# Patient Record
Sex: Female | Born: 1965 | Race: White | Hispanic: No | Marital: Married | Smoking: Never smoker
Health system: Southern US, Community
[De-identification: ages and names within clinical notes are randomized; demographics above are authoritative.]

## PROBLEM LIST (undated history)

## (undated) DIAGNOSIS — O26899 Other specified pregnancy related conditions, unspecified trimester: Secondary | ICD-10-CM

## (undated) DIAGNOSIS — O139 Gestational [pregnancy-induced] hypertension without significant proteinuria, unspecified trimester: Secondary | ICD-10-CM

## (undated) DIAGNOSIS — O360990 Maternal care for other rhesus isoimmunization, unspecified trimester, not applicable or unspecified: Secondary | ICD-10-CM

## (undated) DIAGNOSIS — Z6791 Unspecified blood type, Rh negative: Secondary | ICD-10-CM

## (undated) DIAGNOSIS — O09519 Supervision of elderly primigravida, unspecified trimester: Secondary | ICD-10-CM

## (undated) DIAGNOSIS — B019 Varicella without complication: Secondary | ICD-10-CM

## (undated) DIAGNOSIS — IMO0002 Reserved for concepts with insufficient information to code with codable children: Secondary | ICD-10-CM

## (undated) HISTORY — DX: Gestational (pregnancy-induced) hypertension without significant proteinuria, unspecified trimester: O13.9

## (undated) HISTORY — DX: Maternal care for other rhesus isoimmunization, unspecified trimester, not applicable or unspecified: O36.0990

## (undated) HISTORY — PX: FRACTURE SURGERY: SHX138

## (undated) HISTORY — DX: Unspecified blood type, rh negative: Z67.91

## (undated) HISTORY — DX: Other specified pregnancy related conditions, unspecified trimester: O26.899

## (undated) HISTORY — DX: Supervision of elderly primigravida, unspecified trimester: O09.519

## (undated) HISTORY — DX: Varicella without complication: B01.9

## (undated) HISTORY — DX: Reserved for concepts with insufficient information to code with codable children: IMO0002

---

## 1996-04-09 DIAGNOSIS — IMO0002 Reserved for concepts with insufficient information to code with codable children: Secondary | ICD-10-CM

## 1996-04-09 HISTORY — DX: Reserved for concepts with insufficient information to code with codable children: IMO0002

## 2004-09-13 ENCOUNTER — Inpatient Hospital Stay (HOSPITAL_COMMUNITY): Admission: AD | Admit: 2004-09-13 | Discharge: 2004-09-13 | Payer: Self-pay | Admitting: Obstetrics and Gynecology

## 2004-11-25 ENCOUNTER — Inpatient Hospital Stay (HOSPITAL_COMMUNITY): Admission: AD | Admit: 2004-11-25 | Discharge: 2004-11-27 | Payer: Self-pay | Admitting: Obstetrics and Gynecology

## 2004-11-28 ENCOUNTER — Ambulatory Visit: Admission: RE | Admit: 2004-11-28 | Discharge: 2004-11-28 | Payer: Self-pay | Admitting: Obstetrics and Gynecology

## 2005-04-19 ENCOUNTER — Other Ambulatory Visit: Admission: RE | Admit: 2005-04-19 | Discharge: 2005-04-19 | Payer: Self-pay | Admitting: Obstetrics and Gynecology

## 2012-05-20 ENCOUNTER — Ambulatory Visit: Payer: Self-pay | Admitting: Obstetrics and Gynecology

## 2012-06-04 ENCOUNTER — Ambulatory Visit: Payer: BC Managed Care – PPO | Admitting: Obstetrics and Gynecology

## 2012-06-04 ENCOUNTER — Encounter: Payer: Self-pay | Admitting: Obstetrics and Gynecology

## 2012-06-04 VITALS — BP 100/66 | Resp 18 | Ht 67.0 in | Wt 175.0 lb

## 2012-06-04 DIAGNOSIS — Z124 Encounter for screening for malignant neoplasm of cervix: Secondary | ICD-10-CM

## 2012-06-04 DIAGNOSIS — Z01419 Encounter for gynecological examination (general) (routine) without abnormal findings: Secondary | ICD-10-CM

## 2012-06-04 NOTE — Progress Notes (Signed)
Regular Periods: yes Mammogram: no   Monthly Breast Ex.: yes Exercise: yes  Tetanus < 10 years: pt unsure Seatbelts: yes  NI. Bladder Functn.: yes Abuse at home: no  Daily BM's: yes Stressful Work: no  Healthy Diet: yes Sigmoid-Colonoscopy: never  Calcium: yes Medical problems this year: no concerns per pt    LAST PAP:11/15/10 WNL   Contraception: husband vas   Mammogram: pt does thermography  PCP: none  PMH: no changes   FMH: no changes    Last Bone Scan: never

## 2012-06-04 NOTE — Progress Notes (Signed)
Subjective:    Kimberly Morales is a 47 y.o. female, G2P2002, who presents for an annual exam. The patient reports no complaints.  Menstrual cycle:   LMP: Patient's last menstrual period was 05/20/2012.             Review of Systems Pertinent items are noted in HPI. Denies pelvic pain, urinary tract symptoms, vaginitis symptoms, irregular bleeding, menopausal symptoms, change in bowel habits or rectal bleeding   Objective:    BP 100/66  Resp 18  Ht 5\' 7"  (1.702 m)  Wt 175 lb (79.379 kg)  BMI 27.4 kg/m2  LMP 05/20/2012   Wt Readings from Last 1 Encounters:  06/04/12 175 lb (79.379 kg)   Body mass index is 27.4 kg/(m^2). General Appearance: Alert, no acute distress HEENT: Grossly normal Neck / Thyroid: Supple, no thyromegaly or cervical adenopathy Lungs: Clear to auscultation bilaterally Back: No CVA tenderness Breast Exam: No masses or nodes.No dimpling, nipple retraction or discharge. Cardiovascular: Regular rate and rhythm.  Gastrointestinal: Soft, non-tender, no masses or organomegaly Pelvic Exam: EGBUS-wnl, vagina-normal rugae, cervix- without lesions or tenderness, uterus appears normal size, irregular shape and consistency, adnexae-no masses or tenderness Rectovaginal: no masses and normal sphincter tone Lymphatic Exam: Non-palpable nodes in neck, clavicular,  axillary, or inguinal regions  Skin: no rashes or abnormalities Extremities: no clubbing cyanosis or edema  Neurologic: grossly normal Psychiatric: Alert and oriented    Assessment:   Routine GYN Exam   Plan:    PAP sent  RTO 1 year or prn  Deana Krock,ELMIRAPA-C

## 2012-06-05 LAB — PAP IG W/ RFLX HPV ASCU

## 2012-06-12 ENCOUNTER — Telehealth: Payer: Self-pay | Admitting: Obstetrics and Gynecology

## 2012-06-12 NOTE — Telephone Encounter (Signed)
TC to pt regarding message Pap was normal Pt not ava left pt a voicemail to call the office back.  [facility] CMA

## 2012-06-12 NOTE — Telephone Encounter (Signed)
Pt made aware pap was normal  Pt voiced understanding  [facility] CMA

## 2013-12-01 ENCOUNTER — Other Ambulatory Visit: Payer: Self-pay | Admitting: Internal Medicine

## 2013-12-01 DIAGNOSIS — E039 Hypothyroidism, unspecified: Secondary | ICD-10-CM

## 2013-12-03 ENCOUNTER — Other Ambulatory Visit (HOSPITAL_COMMUNITY): Payer: Self-pay | Admitting: Internal Medicine

## 2013-12-03 DIAGNOSIS — E039 Hypothyroidism, unspecified: Secondary | ICD-10-CM

## 2013-12-08 ENCOUNTER — Other Ambulatory Visit: Payer: Self-pay

## 2013-12-15 ENCOUNTER — Encounter (HOSPITAL_COMMUNITY)
Admission: RE | Admit: 2013-12-15 | Discharge: 2013-12-15 | Disposition: A | Discharge status: Home / Self Care | Payer: BC Managed Care – PPO | Source: Ambulatory Visit | Attending: Internal Medicine | Admitting: Internal Medicine

## 2013-12-15 DIAGNOSIS — E039 Hypothyroidism, unspecified: Secondary | ICD-10-CM | POA: Insufficient documentation

## 2013-12-16 ENCOUNTER — Encounter (HOSPITAL_COMMUNITY)
Admission: RE | Admit: 2013-12-16 | Discharge: 2013-12-16 | Disposition: A | Discharge status: Home / Self Care | Payer: BC Managed Care – PPO | Source: Ambulatory Visit | Attending: Internal Medicine | Admitting: Internal Medicine

## 2013-12-16 DIAGNOSIS — E039 Hypothyroidism, unspecified: Secondary | ICD-10-CM | POA: Diagnosis not present

## 2013-12-16 MED ORDER — SODIUM PERTECHNETATE TC 99M INJECTION
11.0000 | Freq: Once | INTRAVENOUS | Status: AC | PRN
  Administered 2013-12-16: 11 via INTRAVENOUS

## 2013-12-16 MED ORDER — SODIUM IODIDE I 131 CAPSULE
13.2000 | Freq: Once | INTRAVENOUS | Status: AC | PRN
  Administered 2013-12-16: 13.2 via ORAL

## 2014-02-08 ENCOUNTER — Encounter: Payer: Self-pay | Admitting: Obstetrics and Gynecology

## 2014-10-24 ENCOUNTER — Emergency Department (HOSPITAL_BASED_OUTPATIENT_CLINIC_OR_DEPARTMENT_OTHER): Payer: BLUE CROSS/BLUE SHIELD

## 2014-10-24 ENCOUNTER — Encounter (HOSPITAL_BASED_OUTPATIENT_CLINIC_OR_DEPARTMENT_OTHER): Payer: Self-pay | Admitting: *Deleted

## 2014-10-24 ENCOUNTER — Emergency Department (HOSPITAL_BASED_OUTPATIENT_CLINIC_OR_DEPARTMENT_OTHER)
Admission: EM | Admit: 2014-10-24 | Discharge: 2014-10-24 | Disposition: A | Discharge status: Home / Self Care | Payer: BLUE CROSS/BLUE SHIELD | Attending: Emergency Medicine | Admitting: Emergency Medicine

## 2014-10-24 DIAGNOSIS — D72829 Elevated white blood cell count, unspecified: Secondary | ICD-10-CM | POA: Diagnosis not present

## 2014-10-24 DIAGNOSIS — R1033 Periumbilical pain: Secondary | ICD-10-CM | POA: Diagnosis present

## 2014-10-24 DIAGNOSIS — Z3202 Encounter for pregnancy test, result negative: Secondary | ICD-10-CM | POA: Diagnosis not present

## 2014-10-24 DIAGNOSIS — K429 Umbilical hernia without obstruction or gangrene: Secondary | ICD-10-CM | POA: Diagnosis not present

## 2014-10-24 DIAGNOSIS — Z8619 Personal history of other infectious and parasitic diseases: Secondary | ICD-10-CM | POA: Insufficient documentation

## 2014-10-24 LAB — URINALYSIS, ROUTINE W REFLEX MICROSCOPIC
BILIRUBIN URINE: NEGATIVE
Glucose, UA: NEGATIVE mg/dL
HGB URINE DIPSTICK: NEGATIVE
KETONES UR: NEGATIVE mg/dL
Leukocytes, UA: NEGATIVE
Nitrite: NEGATIVE
PH: 6 (ref 5.0–8.0)
Protein, ur: NEGATIVE mg/dL
Specific Gravity, Urine: 1.005 (ref 1.005–1.030)
Urobilinogen, UA: 0.2 mg/dL (ref 0.0–1.0)

## 2014-10-24 LAB — CBC WITH DIFFERENTIAL/PLATELET
Basophils Absolute: 0 10*3/uL (ref 0.0–0.1)
Basophils Relative: 0 % (ref 0–1)
Eosinophils Absolute: 0.1 10*3/uL (ref 0.0–0.7)
Eosinophils Relative: 1 % (ref 0–5)
HCT: 38.3 % (ref 36.0–46.0)
Hemoglobin: 13 g/dL (ref 12.0–15.0)
LYMPHS ABS: 3.1 10*3/uL (ref 0.7–4.0)
LYMPHS PCT: 24 % (ref 12–46)
MCH: 30.5 pg (ref 26.0–34.0)
MCHC: 33.9 g/dL (ref 30.0–36.0)
MCV: 89.9 fL (ref 78.0–100.0)
Monocytes Absolute: 0.7 10*3/uL (ref 0.1–1.0)
Monocytes Relative: 5 % (ref 3–12)
NEUTROS PCT: 70 % (ref 43–77)
Neutro Abs: 9.3 10*3/uL — ABNORMAL HIGH (ref 1.7–7.7)
PLATELETS: 287 10*3/uL (ref 150–400)
RBC: 4.26 MIL/uL (ref 3.87–5.11)
RDW: 11.8 % (ref 11.5–15.5)
WBC: 13.3 10*3/uL — AB (ref 4.0–10.5)

## 2014-10-24 LAB — COMPREHENSIVE METABOLIC PANEL
ALT: 14 U/L (ref 14–54)
ANION GAP: 8 (ref 5–15)
AST: 16 U/L (ref 15–41)
Albumin: 4.4 g/dL (ref 3.5–5.0)
Alkaline Phosphatase: 56 U/L (ref 38–126)
BUN: 15 mg/dL (ref 6–20)
CALCIUM: 9.4 mg/dL (ref 8.9–10.3)
CHLORIDE: 103 mmol/L (ref 101–111)
CO2: 27 mmol/L (ref 22–32)
Creatinine, Ser: 0.6 mg/dL (ref 0.44–1.00)
Glucose, Bld: 91 mg/dL (ref 65–99)
Potassium: 3.2 mmol/L — ABNORMAL LOW (ref 3.5–5.1)
Sodium: 138 mmol/L (ref 135–145)
Total Bilirubin: 0.5 mg/dL (ref 0.3–1.2)
Total Protein: 7.3 g/dL (ref 6.5–8.1)

## 2014-10-24 LAB — LIPASE, BLOOD: LIPASE: 27 U/L (ref 22–51)

## 2014-10-24 LAB — PREGNANCY, URINE: Preg Test, Ur: NEGATIVE

## 2014-10-24 MED ORDER — ACETAMINOPHEN 500 MG PO TABS
1000.0000 mg | ORAL_TABLET | Freq: Once | ORAL | Status: DC
  Filled 2014-10-24: qty 2

## 2014-10-24 MED ORDER — IOHEXOL 300 MG/ML  SOLN
100.0000 mL | Freq: Once | INTRAMUSCULAR | Status: AC | PRN
  Administered 2014-10-24: 100 mL via INTRAVENOUS

## 2014-10-24 MED ORDER — IBUPROFEN 400 MG PO TABS
400.0000 mg | ORAL_TABLET | Freq: Once | ORAL | Status: AC
  Administered 2014-10-24: 400 mg via ORAL
  Filled 2014-10-24: qty 1

## 2014-10-24 MED ORDER — IOHEXOL 300 MG/ML  SOLN
25.0000 mL | Freq: Once | INTRAMUSCULAR | Status: AC | PRN
  Administered 2014-10-24: 25 mL via ORAL

## 2014-10-24 NOTE — Discharge Instructions (Signed)
For pain control please take Ibuprofen (also known as Motrin or Advil)  (this is normally 2 over the counter pills) every 6 hours. Take with food to minimize stomach irritation.  Please follow with your primary care doctor in the next 2 days for a check-up. They must obtain records for further management.   Do not hesitate to return to the Emergency Department for any new, worsening or concerning symptoms.    Umbilical Herniorrhaphy Herniorrhaphy is surgery to repair a hernia. A hernia is the protrusion of a part of an organ through an abdominal opening. An umbilical hernia means that your hernia is in the area around your navel. If the hernia is not repaired, the gap could get bigger. Your intestines or other tissues, such as fat, could get trapped in the gap. This can lead to other health problems, such as blocked intestines. If the hernia is fixed before problems set in, you may be allowed to go home the same day as the surgery (outpatient). LET [facility] CARE PROVIDER KNOW ABOUT:  Allergies to food or medicine.  Medicines taken, including vitamins, herbs, eye drops, over-the-counter medicines, and creams.  Use of steroids (by mouth or creams).  Previous problems with anesthetics or numbing medicines.  History of bleeding problems or blood clots.  Previous surgery.  Other health problems, including diabetes and kidney problems.  Possibility of pregnancy, if this applies. RISKS AND COMPLICATIONS  Pain.  Excessive bleeding.  Hematoma. This is a pocket of blood that collects under the surgery site.  Infection at the surgery site.  Numbness at the surgery site.  Swelling and bruising.  Blood clots.  Intestinal damage (rare).  Scarring.  Skin damage.  Development of another hernia. This may require another surgery. BEFORE THE PROCEDURE  Ask your health care provider about changing or stopping your regular medicines. You may need to stop taking aspirin,  nonsteroidal anti-inflammatory drugs (NSAIDs), vitamin E, and blood thinners as early as 2 weeks before the procedure.  Do not eat or drink for 8 hours before the procedure, or as directed by your health care provider.  You might be asked to shower or wash with an antibacterial soap before the procedure.  Wear comfortable clothes that will be easy to put on after the procedure. PROCEDURE You will be given an intravenous (IV) tube. A needle will be inserted in your arm. Medicine will flow directly into your body through this needle. You might be given medicine to help you relax (sedative). You will be given medicine that numbs the area (local anesthetic) or medicine that makes you sleep (general anesthetic). If you have open surgery:  The surgeon will make a cut (incision) in your abdomen.  The gap in the muscle wall will be repaired. The surgeon may sew the edges together over the gap or use a mesh material to strengthen the area. When mesh is used, the body grows new, strong tissue into and around it. This new tissue closes the gap.  A drain might be put in to remove excess fluid from the body after surgery.  The surgeon will close the incision with stitches, glue, or staples. If you have laparoscopic surgery:  The surgeon will make several small incisions in your abdomen.  A thin, lighted tube (laparoscope) will be inserted into the abdomen through an incision. A camera is attached to the laparoscope that allows the surgeon to see inside the abdomen.  Tools will be inserted through the other incisions to repair the hernia.  Usually, mesh is used to cover the gap.  The surgeon will close the incisions with stitches. AFTER THE PROCEDURE  You will be taken to a recovery area. A nurse will watch and check your progress.  When you are awake, feeling well, and taking fluids well, you may be allowed to go home. In some cases, you may need to stay overnight in the hospital.  Arrange for  someone to drive you home. Document Released: 06/22/2008 Document Revised: 08/10/2013 Document Reviewed: 06/27/2011 [facility] Patient Information 2015 [employer] [place]. This information is not intended to replace advice given to you by your health care provider. Make sure you discuss any questions you have with your health care provider.

## 2014-10-24 NOTE — ED Notes (Signed)
Pt reports umbilical pain x 1 week

## 2014-10-24 NOTE — ED Provider Notes (Signed)
CSN: [id number]     Arrival date & time 10/24/14  1833 History   First MD Initiated Contact with Patient 10/24/14 1940     Chief Complaint  Patient presents with  . Abdominal Pain     (Consider location/radiation/quality/duration/timing/severity/associated sxs/prior Treatment) HPI  Blood pressure 139/81, pulse 98, temperature 97.9 F (36.6 C), temperature source Oral, resp. rate 18, height 5\' 9"  (1.753 m), weight 165 lb (74.844 kg), last menstrual period 10/17/2014, SpO2 100 %.  Kimberly Morales is a 49 y.o. female complaining of periumbilical pain worsening over the course of the week. Patient denies fever, chills, nausea, vomiting, decreased by mouth intake, change in bowel or bladder habits. She rates her pain at 5 out of 10, no pain medication taken prior to arrival. She states that the pain is worse in the right upper and epigastrium. States that she feels like it may be originating from a scratch that she sustained inside the umbilicus approximately one week ago.  Past Medical History  Diagnosis Date  . AMA (advanced maternal age) primigravida 35+   . PIH (pregnancy induced hypertension)   . Rh alloimmunization, maternal, antepartum   . Rh negative status during pregnancy   . Abnormal Pap smear 1998  . Varicella    Past Surgical History  Procedure Laterality Date  . Fracture surgery     Family History  Problem Relation Age of Onset  . Heart disease Father   . Lupus Father   . Breast cancer Maternal Aunt    History  Substance Use Topics  . Smoking status: Never Smoker   . Smokeless tobacco: Never Used  . Alcohol Use: Yes     Comment: social   OB History    Gravida Para Term Preterm AB TAB SAB Ectopic Multiple Living   2 2 2       2      Review of Systems  10 systems reviewed and found to be negative, except as noted in the HPI.   Allergies  Review of patient's allergies indicates no known allergies.  Home Medications   Prior to Admission medications    Not on File   BP 126/74 mmHg  Pulse 89  Temp(Src) 97.9 F (36.6 C) (Oral)  Resp 18  Ht 5\' 9"  (1.753 m)  Wt 165 lb (74.844 kg)  BMI 24.36 kg/m2  SpO2 100%  LMP 10/17/2014 Physical Exam  Constitutional: She is oriented to person, place, and time. She appears well-developed and well-nourished. No distress.  HENT:  Head: Normocephalic.  Eyes: Conjunctivae and EOM are normal.  Cardiovascular: Normal rate.   Pulmonary/Chest: Effort normal. No stridor.  Abdominal: There is tenderness.  Normal active bowel sounds, patient is tender to palpation in the epigastrium and right upper quadrant, RLQ, no guarding or rebound.  Rovsing, psoas and obturator are negative.  Musculoskeletal: Normal range of motion.  Neurological: She is alert and oriented to person, place, and time.  Psychiatric: She has a normal mood and affect.  Nursing note and vitals reviewed.   ED Course  Procedures (including critical care time) Labs Review Labs Reviewed  CBC WITH DIFFERENTIAL/PLATELET - Abnormal; Notable for the following:    WBC 13.3 (*)    Neutro Abs 9.3 (*)    All other components within normal limits  COMPREHENSIVE METABOLIC PANEL - Abnormal; Notable for the following:    Potassium 3.2 (*)    All other components within normal limits  LIPASE, BLOOD  URINALYSIS, ROUTINE W REFLEX MICROSCOPIC (NOT AT [facility])  PREGNANCY, URINE    Imaging Review Ct Abdomen Pelvis W Contrast  10/24/2014   CLINICAL DATA:  49 year old female with periumbilical pain.  EXAM: CT ABDOMEN AND PELVIS WITH CONTRAST  TECHNIQUE: Multidetector CT imaging of the abdomen and pelvis was performed using the standard protocol following bolus administration of intravenous contrast.  CONTRAST:  25mL OMNIPAQUE IOHEXOL 300 MG/ML SOLN, OMNIPAQUE IOHEXOL 300 MG/ML SOLN  COMPARISON:  Abdominal ultrasound report dated 12/16/2012  FINDINGS: The visualized lung bases are clear. No intra-abdominal free air. Small free fluid within the  pelvis.  The liver, gallbladder, pancreas spleen, adrenal glands, kidneys, visualized ureters, and urinary bladder appear unremarkable. The uterus is anteverted and grossly unremarkable. The ovaries appear unremarkable.  There is moderate stool throughout the colon with no evidence of bowel obstruction. The appendix appears unremarkable. There is apparent diffuse thickening of the gastric antrum, likely secondary to inflammatory changes of the adjacent mesentery. Gastritis is less likely.  The visualized abdominal aorta and IVC are patent. No portal venous gas and 5. The there is no lymphadenopathy.  There is a small fat containing umbilical hernia. There is inflammatory changes of the omental fat at the level of the umbilicus with mild inflammatory changes of the herniated umbilical fat. 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 Inflammation secondary to strangulation of the herniated fat is less likely as the epicenter of inflammation appears to be within the omental and to a lesser degree involving the herniated umbilical fat. clinical correlation is recommended. There is no drainable fluid collection/ abscess.  IMPRESSION: Inflammatory changes of the periumbilical omentum as well as mild inflammation of the fat containing umbilical hernia. 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 No abscess. Clinical correlation recommended.   Electronically Signed   By: Elgie Collard M.D.   On: 10/24/2014 22:49     EKG Interpretation None      MDM   Final diagnoses:  Umbilical hernia, recurrence not specified    Filed Vitals:   10/24/14 1836 10/24/14 2048 10/24/14 2259  BP: 139/81 132/72 126/74  Pulse: 98 98 89  Temp: 97.9 F (36.6 C)    TempSrc: Oral    Resp: Height:  (1.753 m)    Weight: 165 lb (74.844 kg)    SpO2: 100% 100% 100%    Medications  ibuprofen (ADVIL,MOTRIN) tablet 400 mg (400 mg  Oral Given 10/24/14 2006)  iohexol (OMNIPAQUE) 300 MG/ML solution 25 mL (25 mLs Oral Contrast Given 10/24/14 2110)  iohexol (OMNIPAQUE) 300 MG/ML solution 100 mL (100 mLs Intravenous Contrast Given 10/24/14 2218)    Kimberly Morales is a pleasant 49 y.o. female presenting with epigastric pain worsening over the course of a week, no GI symptoms, no signs of systemic infection. Patient is tender to palpation in the right upper quadrant and umbilicus, will check basic blood work.   She has a leukocytosis of 13.3. Recheck abdominal exam with focal tenderness palpation the right lower quadrant, will CT. abdominal exam remains nonsurgical patient reports significant improvement with Motrin. Advised her to remain nothing by mouth  CT shows inflammatory changes in the period umbilical omental and mild inflammation file containing umbilical hernia. Results discussed with patient, abdominal exam remains benign, patient is afebrile, no nausea no vomiting. Will give general surgery outpatient consult and patient is advised on print flags to return to ED.  This is a shared visit with the attending physician who  personally evaluated the patient and agrees with the care plan.   Evaluation does not show pathology that would require ongoing emergent intervention or inpatient treatment. Pt is hemodynamically stable and mentating appropriately. Discussed findings and plan with patient/guardian, who agrees with care plan. All questions answered. Return precautions discussed and outpatient follow up given.        Wynetta Emeryicole Leroi Haque, PA-C 10/24/14 2345  Doug SouSam Jacubowitz, MD 10/25/14 [phone]

## 2014-10-24 NOTE — ED Provider Notes (Addendum)
comPlains of umbilical pain for one week becoming worse tonight. Pain worse with flexing at the waist. No anorexia and no fever no nausea or vomiting. Pain much improved after treatment with ibuprofen On exam patient is in no distress abdomen nondistended normal active bowel sounds, mild umbilical tenderness. Questionable tiny umbilical hernia revealed when patient performs Valsalva maneuver  Doug SouSam Caliya Narine, MD 10/24/14 [phone]  Doug SouSam Necole Minassian, MD 10/25/14 [phone]

## 2015-04-21 ENCOUNTER — Other Ambulatory Visit: Payer: Self-pay | Admitting: Internal Medicine

## 2015-04-21 DIAGNOSIS — E065 Other chronic thyroiditis: Secondary | ICD-10-CM

## 2015-04-22 ENCOUNTER — Ambulatory Visit
Admission: RE | Admit: 2015-04-22 | Discharge: 2015-04-22 | Disposition: A | Discharge status: Home / Self Care | Payer: BLUE CROSS/BLUE SHIELD | Source: Ambulatory Visit | Attending: Internal Medicine | Admitting: Internal Medicine

## 2015-04-22 DIAGNOSIS — E065 Other chronic thyroiditis: Secondary | ICD-10-CM

## 2016-02-06 ENCOUNTER — Emergency Department (HOSPITAL_BASED_OUTPATIENT_CLINIC_OR_DEPARTMENT_OTHER)
Admission: EM | Admit: 2016-02-06 | Discharge: 2016-02-06 | Disposition: A | Discharge status: Home / Self Care | Payer: BLUE CROSS/BLUE SHIELD | Attending: Emergency Medicine | Admitting: Emergency Medicine

## 2016-02-06 ENCOUNTER — Encounter (HOSPITAL_BASED_OUTPATIENT_CLINIC_OR_DEPARTMENT_OTHER): Payer: Self-pay | Admitting: *Deleted

## 2016-02-06 DIAGNOSIS — T7840XA Allergy, unspecified, initial encounter: Secondary | ICD-10-CM | POA: Diagnosis not present

## 2016-02-06 DIAGNOSIS — Z79899 Other long term (current) drug therapy: Secondary | ICD-10-CM | POA: Insufficient documentation

## 2016-02-06 DIAGNOSIS — R220 Localized swelling, mass and lump, head: Secondary | ICD-10-CM | POA: Diagnosis present

## 2016-02-06 MED ORDER — PREDNISONE 20 MG PO TABS
40.0000 mg | ORAL_TABLET | Freq: Once | ORAL | Status: AC
  Administered 2016-02-06: 40 mg via ORAL
  Filled 2016-02-06: qty 2

## 2016-02-06 MED ORDER — PREDNISONE 20 MG PO TABS: 40.0000 mg | ORAL_TABLET | Freq: Every day | ORAL | 0 refills | Status: AC

## 2016-02-06 MED ORDER — DIPHENHYDRAMINE HCL 25 MG PO CAPS
25.0000 mg | ORAL_CAPSULE | Freq: Once | ORAL | Status: AC
  Administered 2016-02-06: 25 mg via ORAL
  Filled 2016-02-06: qty 1

## 2016-02-06 NOTE — ED Provider Notes (Signed)
[facility] Provider Note   CSN: [id number] Arrival date & time: 02/06/16  2026 By signing my name below, I, Kimberly Morales, attest that this documentation has been prepared under the direction and in the presence of Kimberly SproutWhitney Eyan Hagood, MD. Electronically Signed: Bridgette HabermannMaria Morales, ED Scribe. 02/06/16. 9:30 PM.  History   Chief Complaint Chief Complaint  Patient presents with  . Facial Swelling   HPI Comments: Kimberly MenghiniMeredith D Bruhn is a 50 y.o. female who presents to the Emergency Department complaining of right orbital swelling and upper lip swelling onset around 6:45 pm today. Pt also notes her voice sounding more hoarse. Pt had a dental procedure on her left lower tooth this morning and was prescribed Naprosyn. Pt has had Claritin with mild relief to her symptoms. Pt notes she has been having hives for the past month. Pt denies fever, rash, difficulty swallowing, shortness of breath, or any other associated symptoms. Pt is in no pain at this time.  The history is provided by the patient. No language interpreter was used.    Past Medical History:  Diagnosis Date  . Abnormal Pap smear 1998  . AMA (advanced maternal age) primigravida 35+   . PIH (pregnancy induced hypertension)   . Rh alloimmunization, maternal, antepartum   . Rh negative status during pregnancy   . Varicella     There are no active problems to display for this patient.   Past Surgical History:  Procedure Laterality Date  . FRACTURE SURGERY      OB History    Gravida Para Term Preterm AB Living   2 2 2     2    SAB TAB Ectopic Multiple Live Births           2       Home Medications    Prior to Admission medications   Medication Sig Start Date End Date Taking? Authorizing Provider  loratadine (CLARITIN) 10 MG tablet Take 10 mg by mouth daily.   Yes Historical Provider, MD    Family History Family History  Problem Relation Age of Onset  . Heart disease Father   . Lupus Father   . Breast cancer  Maternal Aunt     Social History Social History  Substance Use Topics  . Smoking status: Never Smoker  . Smokeless tobacco: Never Used  . Alcohol use Yes     Comment: social     Allergies   Review of patient's allergies indicates no known allergies.   Review of Systems Review of Systems  Constitutional: Negative for fever.  HENT: Positive for facial swelling and voice change. Negative for trouble swallowing.   Respiratory: Negative for shortness of breath.   All other systems reviewed and are negative.    Physical Exam Updated Vital Signs BP 145/90   Pulse 87   Temp 98 F (36.7 C)   Resp 16   Ht 5\' 8"  (1.727 m)   Wt 165 lb (74.8 kg)   LMP 02/04/2016   SpO2 100%   BMI 25.09 kg/m   Physical Exam  Constitutional: She is oriented to person, place, and time. She appears well-developed and well-nourished.  HENT:  Head: Normocephalic and atraumatic.  Eyes: EOM are normal. Pupils are equal, round, and reactive to light.  Neck: Normal range of motion. Neck supple. No JVD present.  Cardiovascular: Normal rate, regular rhythm and normal heart sounds.   No murmur heard. Pulmonary/Chest: Effort normal and breath sounds normal. She has no wheezes. She has no rales.  She exhibits no tenderness.  Abdominal: Soft. Bowel sounds are normal. She exhibits no distension and no mass. There is no tenderness.  Musculoskeletal: Normal range of motion. She exhibits no edema.  Lymphadenopathy:    She has no cervical adenopathy.  Neurological: She is alert and oriented to person, place, and time. No cranial nerve deficit. She exhibits normal muscle tone. Coordination normal.  Skin: Skin is warm and dry. No rash noted.  Hoarse voice but no tongue or uvular swelling. Mild urticaria noted on the back of neck.  Psychiatric: She has a normal mood and affect. Her behavior is normal. Judgment and thought content normal.  Nursing note and vitals reviewed.    ED Treatments / Results    DIAGNOSTIC STUDIES: Oxygen Saturation is 100% on RA, normal by my interpretation.    COORDINATION OF CARE: 9:30 PM Discussed treatment plan with pt at bedside which includes Benadryl and steroids and pt agreed to plan.  Labs (all labs ordered are listed, but only abnormal results are displayed) Labs Reviewed - No data to display  EKG  EKG Interpretation None       Radiology No results found.  Procedures Procedures (including critical care time)  Medications Ordered in ED Medications - No data to display   Initial Impression / Assessment and Plan / ED Course  I have reviewed the triage vital signs and the nursing notes.  Pertinent labs & imaging results that were available during my care of the patient were reviewed by me and considered in my medical decision making (see chart for details).  Clinical Course   Pt with recent hives but today much worse with facial swelling.  Noted dental procedure today but fine after but this even facial swelling.  Pt did start taking naproxen which does not think she has ever had.  Patient has no throat or tongue swelling. No swallowing difficulty or breathing trouble.  No signs of anaphylaxis but mild hives on the back, neck and hoarse voice. Patient given Benadryl and prednisone.  Patient will follow-up with her PCP and given instructions to follow-up with allergy   Final Clinical Impressions(s) / ED Diagnoses   Final diagnoses:  Allergic reaction, initial encounter    New Prescriptions Discharge Medication List as of 02/06/2016 10:41 PM    START taking these medications   Details  predniSONE (DELTASONE) 20 MG tablet Take 2 tablets (40 mg total) by mouth daily., Starting Mon 02/06/2016, Print       I personally performed the services described in this documentation, which was scribed in my presence.  The recorded information has been reviewed and considered.     Kimberly SproutWhitney Kimberly Yeh, MD 02/06/16 [phone]

## 2016-02-06 NOTE — ED Triage Notes (Addendum)
Pt c/o right orbital swelling and upper lip swelling x 1 hr. Claritin pta , no resp distress noted , dental procedure at 1130 today

## 2016-02-06 NOTE — ED Notes (Signed)
C/o swelling around rt eye and upper lip onset this pm,  Had dental procedure on left lower tooth this am

## 2016-04-24 ENCOUNTER — Other Ambulatory Visit: Payer: Self-pay | Admitting: Family Medicine

## 2016-04-24 ENCOUNTER — Other Ambulatory Visit: Payer: Self-pay | Admitting: Nurse Practitioner

## 2016-04-24 DIAGNOSIS — E041 Nontoxic single thyroid nodule: Secondary | ICD-10-CM

## 2016-04-24 DIAGNOSIS — R760 Raised antibody titer: Secondary | ICD-10-CM

## 2016-04-27 ENCOUNTER — Other Ambulatory Visit: Payer: BLUE CROSS/BLUE SHIELD

## 2016-04-30 ENCOUNTER — Other Ambulatory Visit: Payer: BLUE CROSS/BLUE SHIELD

## 2016-05-02 ENCOUNTER — Ambulatory Visit
Admission: RE | Admit: 2016-05-02 | Discharge: 2016-05-02 | Disposition: A | Discharge status: Home / Self Care | Payer: BLUE CROSS/BLUE SHIELD | Source: Ambulatory Visit | Attending: Nurse Practitioner | Admitting: Nurse Practitioner

## 2016-05-02 DIAGNOSIS — R760 Raised antibody titer: Secondary | ICD-10-CM

## 2016-05-02 DIAGNOSIS — E041 Nontoxic single thyroid nodule: Secondary | ICD-10-CM

## 2018-08-20 ENCOUNTER — Other Ambulatory Visit: Payer: Self-pay | Admitting: Nurse Practitioner

## 2018-08-20 DIAGNOSIS — E041 Nontoxic single thyroid nodule: Secondary | ICD-10-CM

## 2018-08-21 ENCOUNTER — Ambulatory Visit
Admission: RE | Admit: 2018-08-21 | Discharge: 2018-08-21 | Disposition: A | Discharge status: Home / Self Care | Payer: BLUE CROSS/BLUE SHIELD | Source: Ambulatory Visit | Attending: Nurse Practitioner | Admitting: Nurse Practitioner

## 2018-08-21 DIAGNOSIS — E041 Nontoxic single thyroid nodule: Secondary | ICD-10-CM

## 2020-10-31 ENCOUNTER — Other Ambulatory Visit: Payer: Self-pay | Admitting: Family Medicine

## 2020-10-31 DIAGNOSIS — E0590 Thyrotoxicosis, unspecified without thyrotoxic crisis or storm: Secondary | ICD-10-CM

## 2020-10-31 DIAGNOSIS — E041 Nontoxic single thyroid nodule: Secondary | ICD-10-CM

## 2020-11-18 ENCOUNTER — Other Ambulatory Visit: Payer: Self-pay

## 2020-11-18 ENCOUNTER — Ambulatory Visit
Admission: RE | Admit: 2020-11-18 | Discharge: 2020-11-18 | Disposition: A | Discharge status: Home / Self Care | Payer: BC Managed Care – PPO | Source: Ambulatory Visit | Attending: Family Medicine | Admitting: Family Medicine

## 2020-11-18 DIAGNOSIS — E0590 Thyrotoxicosis, unspecified without thyrotoxic crisis or storm: Secondary | ICD-10-CM

## 2020-11-18 DIAGNOSIS — E041 Nontoxic single thyroid nodule: Secondary | ICD-10-CM

## 2020-11-21 IMAGING — US US THYROID
1 series · 14 of 25 positions shown · non-contrast
Comparison: Prior thyroid ultrasound 05/02/2016

CLINICAL DATA: Goiter.

EXAM:
THYROID ULTRASOUND
TECHNIQUE: Ultrasound examination of the thyroid gland and adjacent soft
tissues was performed.

[Series 1: us thyroid · 0.05mm/px · 14 of 54 slices shown]
[im 1/54]
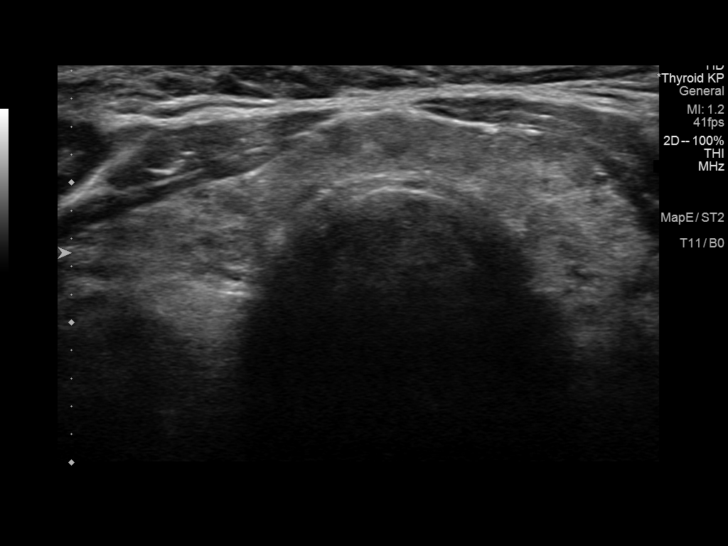
[im 5/54]
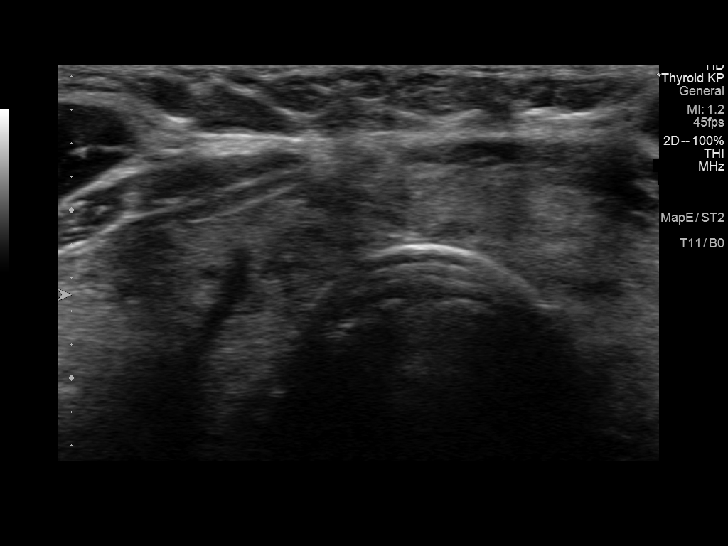
[im 9/54]
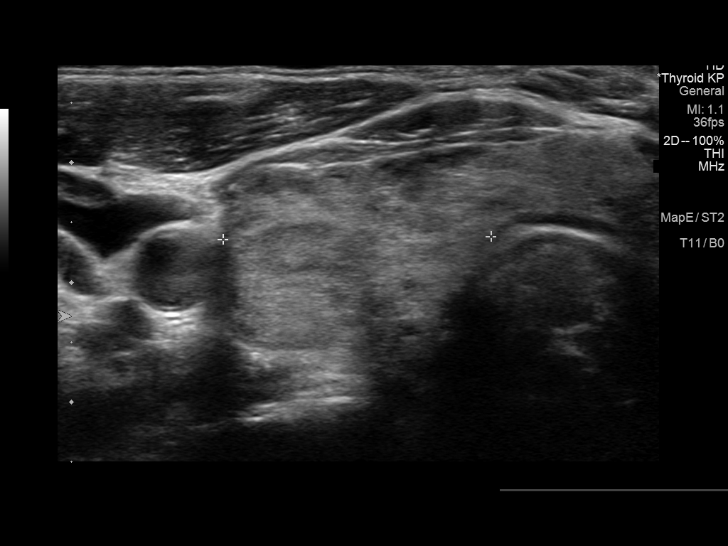
[im 14/54]
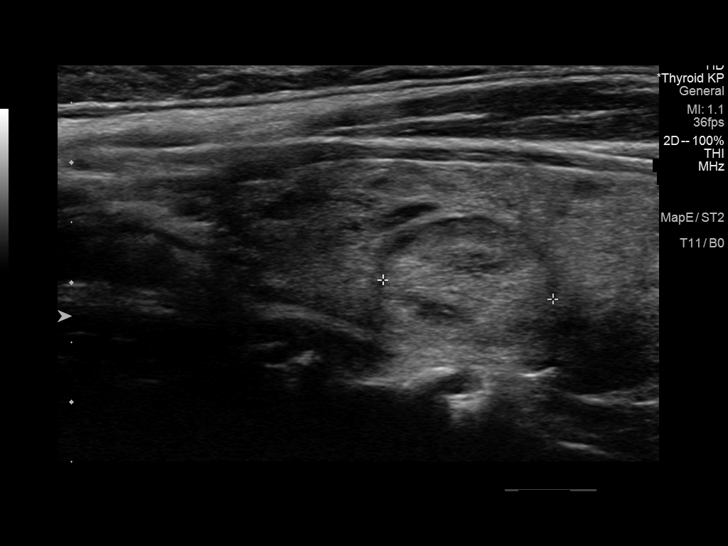
[im 18/54]
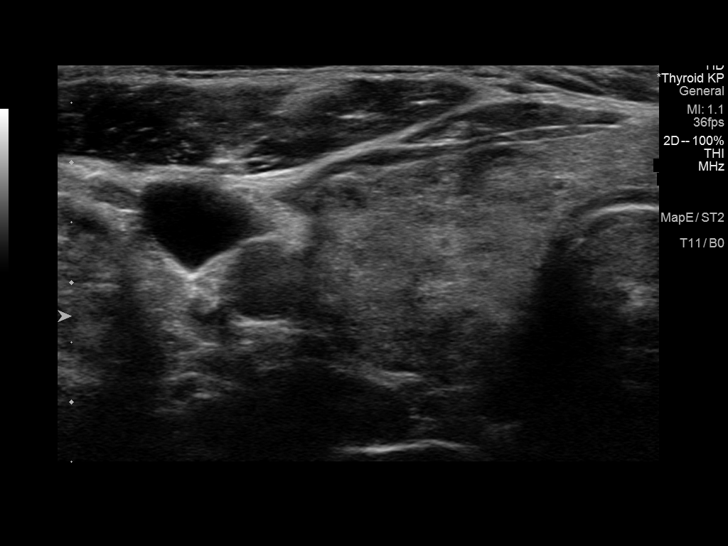
[im 20/54]
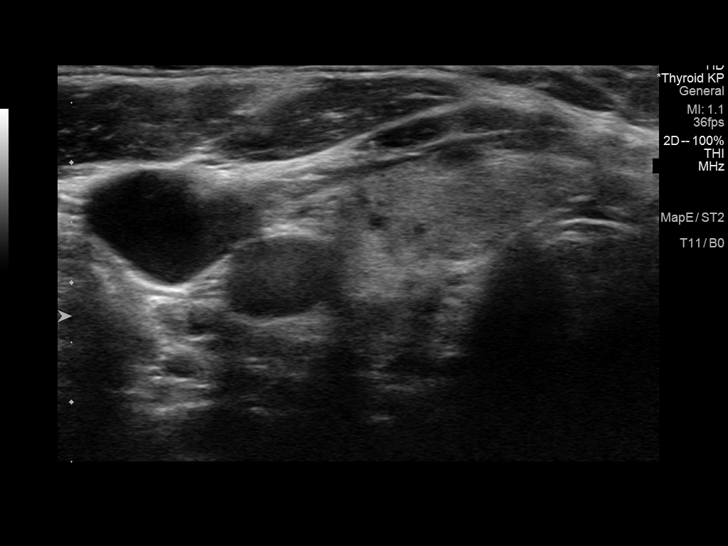
[im 25/54]
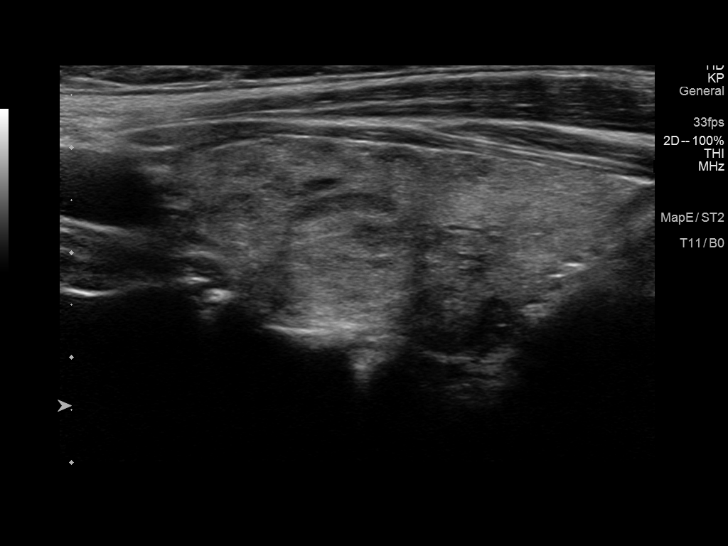
[im 29/54]
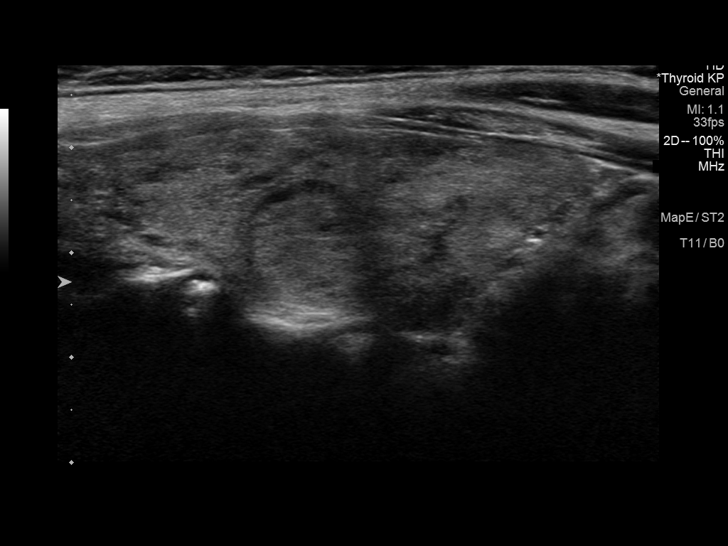
[im 34/54]
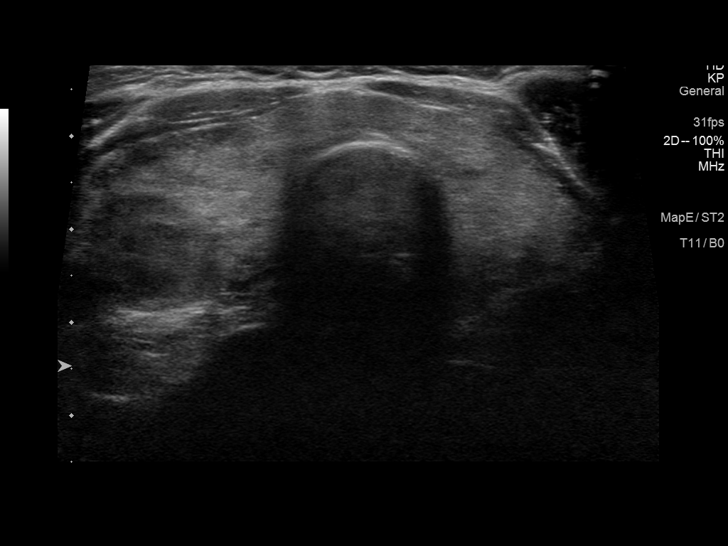
[im 36/54]
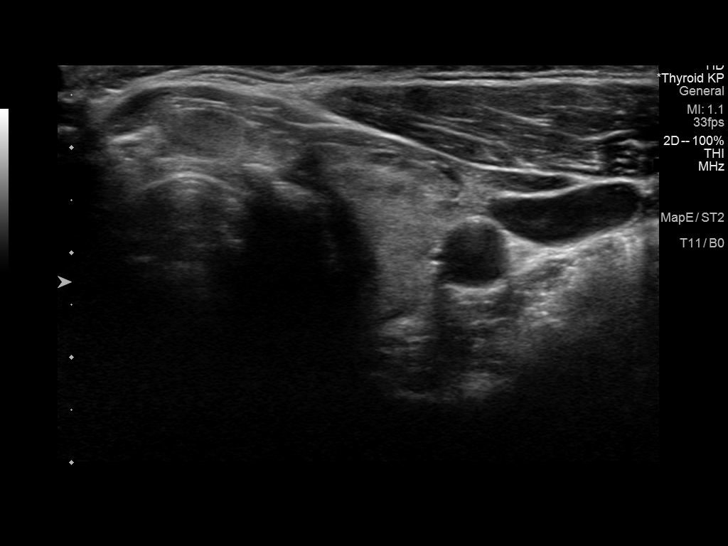
[im 40/54]
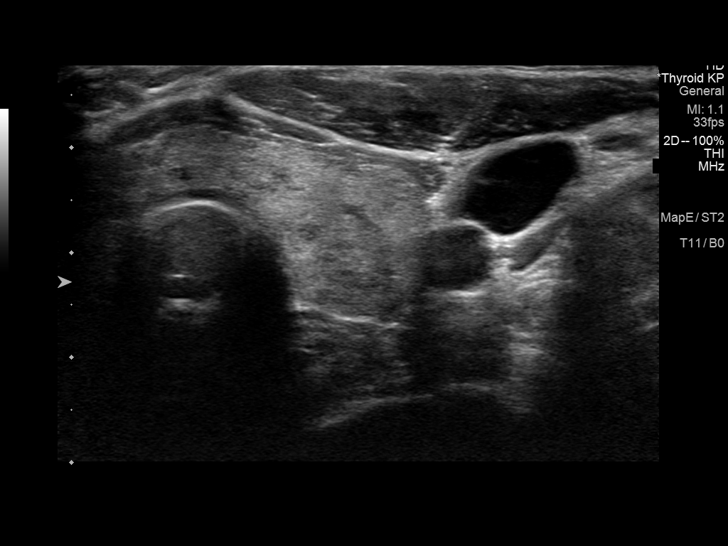
[im 45/54]
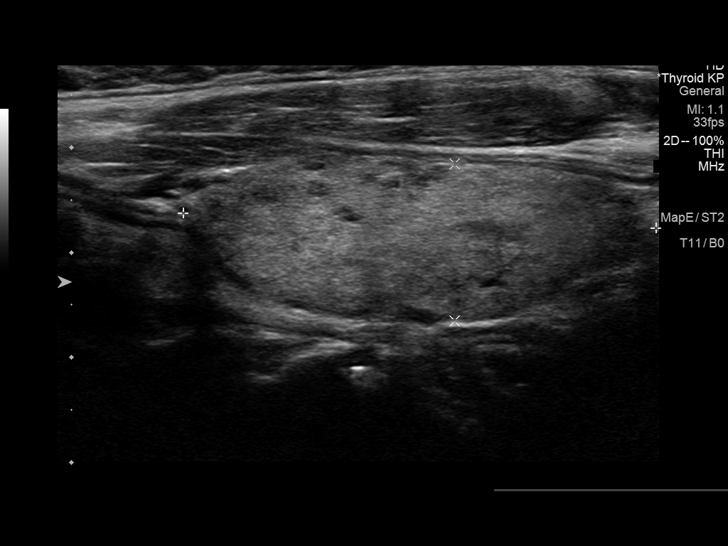
[im 49/54]
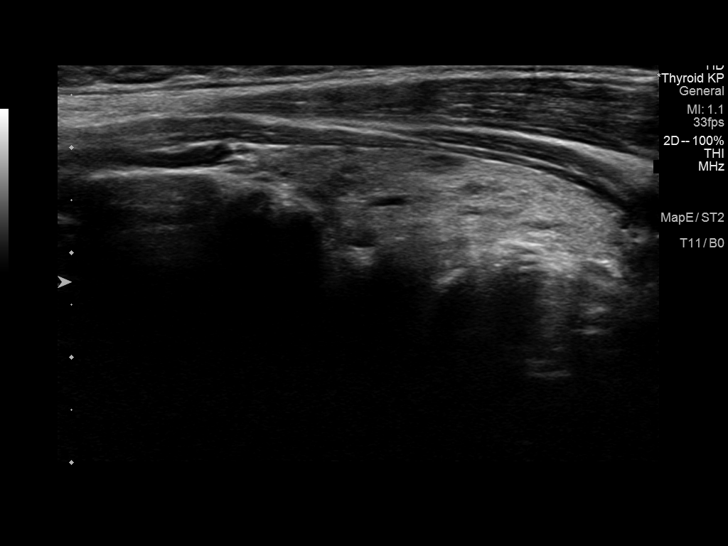
[im 54/54]
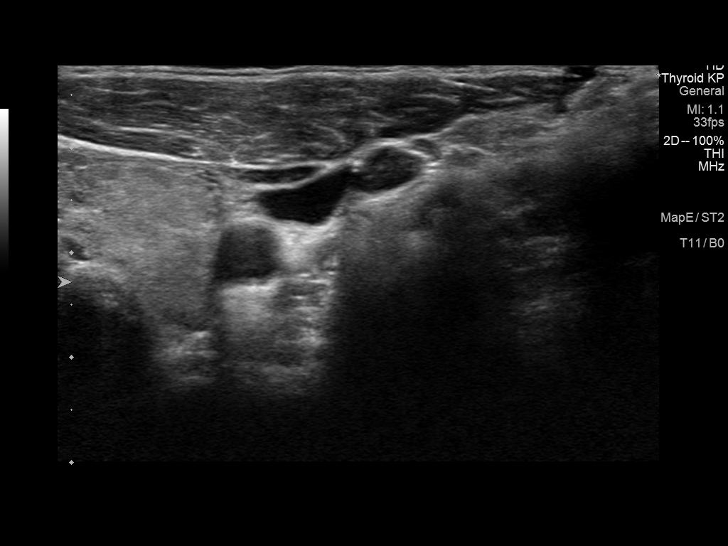

[14 of 25 positions shown; findings below may reference images not displayed]

FINDINGS: Parenchymal Echotexture: Moderately heterogenous

Isthmus: 0.5 cm

Right lobe: 4.4 x 1.9 x 2.2 cm

Left lobe: 4.5 x 1.5 x 1.8 cm

_________________________________________________________

Estimated total number of nodules >/= 1 cm: 1

Number of spongiform nodules >/=  2 cm not described below (TR1): 0

Number of mixed cystic and solid nodules >/= 1.5 cm not described
below (TR2): 0

_________________________________________________________

Similar appearance of an isoechoic circumscribed solid nodule in the
right mid gland which measures 1.4 x 1.2 x 1.0 cm compared to 1.3 x
1.3 x 1.0 cm previously. There is no significant interval change.
Similarly, the thyroid nodule also measured 1.4 cm in 8551.
IMPRESSION: Three year stability of solitary right-sided thyroid nodule which
does not meet size criteria for further evaluation.

No new thyroid nodule or abnormality identified.

The above is in keeping with the ACR TI-RADS recommendations - [HOSPITAL] 8551;[DATE].

## 2022-06-06 ENCOUNTER — Other Ambulatory Visit: Payer: Self-pay | Admitting: Family Medicine

## 2022-06-06 ENCOUNTER — Encounter: Payer: Self-pay | Admitting: Family Medicine

## 2022-06-06 DIAGNOSIS — E041 Nontoxic single thyroid nodule: Secondary | ICD-10-CM

## 2022-07-10 ENCOUNTER — Other Ambulatory Visit: Payer: BC Managed Care – PPO

## 2022-07-17 ENCOUNTER — Ambulatory Visit
Admission: RE | Admit: 2022-07-17 | Discharge: 2022-07-17 | Disposition: A | Discharge status: Home / Self Care | Payer: BC Managed Care – PPO | Source: Ambulatory Visit | Attending: Family Medicine | Admitting: Family Medicine

## 2022-07-17 DIAGNOSIS — E041 Nontoxic single thyroid nodule: Secondary | ICD-10-CM

## 2022-07-31 ENCOUNTER — Other Ambulatory Visit: Payer: Self-pay | Admitting: Obstetrics and Gynecology

## 2022-11-05 ENCOUNTER — Other Ambulatory Visit: Payer: Self-pay | Admitting: Plastic Surgery
